# Patient Record
Sex: Female | Born: 1990 | Race: Black or African American | Hispanic: No | Marital: Single | Smoking: Former smoker
Health system: Southern US, Community
[De-identification: ages and names within clinical notes are randomized; demographics above are authoritative.]

## PROBLEM LIST (undated history)

## (undated) ENCOUNTER — Inpatient Hospital Stay (HOSPITAL_COMMUNITY): Payer: Self-pay

## (undated) DIAGNOSIS — L309 Dermatitis, unspecified: Secondary | ICD-10-CM

## (undated) DIAGNOSIS — J45909 Unspecified asthma, uncomplicated: Secondary | ICD-10-CM

## (undated) HISTORY — PX: NO PAST SURGERIES: SHX2092

---

## 2014-03-03 ENCOUNTER — Emergency Department (HOSPITAL_COMMUNITY)
Admission: EM | Admit: 2014-03-03 | Discharge: 2014-03-03 | Disposition: A | Discharge status: Home / Self Care | Payer: Medicaid Other | Source: Home / Self Care | Attending: Family Medicine | Admitting: Family Medicine

## 2014-03-03 ENCOUNTER — Encounter (HOSPITAL_COMMUNITY): Payer: Self-pay | Admitting: Emergency Medicine

## 2014-03-03 DIAGNOSIS — J111 Influenza due to unidentified influenza virus with other respiratory manifestations: Secondary | ICD-10-CM

## 2014-03-03 DIAGNOSIS — R69 Illness, unspecified: Principal | ICD-10-CM

## 2014-03-03 MED ORDER — TRAMADOL HCL 50 MG PO TABS: 50.0000 mg | ORAL_TABLET | Freq: Every evening | ORAL | Status: DC | PRN

## 2014-03-03 MED ORDER — IPRATROPIUM BROMIDE 0.06 % NA SOLN: 2.0000 | Freq: Four times a day (QID) | NASAL | Status: DC

## 2014-03-03 MED ORDER — OSELTAMIVIR PHOSPHATE 75 MG PO CAPS: 75.0000 mg | ORAL_CAPSULE | Freq: Two times a day (BID) | ORAL | Status: DC

## 2014-03-03 NOTE — Discharge Instructions (Signed)
Thank you for coming in today. Take medication as directed. Take Tylenol as well. Call or go to the emergency room if you get worse, have trouble breathing, have chest pains, or palpitations.   Influenza, Adult Influenza ("the flu") is a viral infection of the respiratory tract. It occurs more often in winter months because people spend more time in close contact with one another. Influenza can make you feel very sick. Influenza easily spreads from person to person (contagious). CAUSES  Influenza is caused by a virus that infects the respiratory tract. You can catch the virus by breathing in droplets from an infected person's cough or sneeze. You can also catch the virus by touching something that was recently contaminated with the virus and then touching your mouth, nose, or eyes. SYMPTOMS  Symptoms typically last 4 to 10 days and may include:  Fever.  Chills.  Headache, body aches, and muscle aches.  Sore throat.  Chest discomfort and cough.  Poor appetite.  Weakness or feeling tired.  Dizziness.  Nausea or vomiting. DIAGNOSIS  Diagnosis of influenza is often made based on your history and a physical exam. A nose or throat swab test can be done to confirm the diagnosis. RISKS AND COMPLICATIONS You may be at risk for a more severe case of influenza if you smoke cigarettes, have diabetes, have chronic heart disease (such as heart failure) or lung disease (such as asthma), or if you have a weakened immune system. Elderly people and pregnant women are also at risk for more serious infections. The most common complication of influenza is a lung infection (pneumonia). Sometimes, this complication can require emergency medical care and may be life-threatening. PREVENTION  An annual influenza vaccination (flu shot) is the best way to avoid getting influenza. An annual flu shot is now routinely recommended for all adults in the [place] TREATMENT  In mild cases, influenza goes away on its  own. Treatment is directed at relieving symptoms. For more severe cases, your caregiver may prescribe antiviral medicines to shorten the sickness. Antibiotic medicines are not effective, because the infection is caused by a virus, not by bacteria. HOME CARE INSTRUCTIONS  Only take over-the-counter or prescription medicines for pain, discomfort, or fever as directed by your caregiver.  Use a cool mist humidifier to make breathing easier.  Get plenty of rest until your temperature returns to normal. This usually takes 3 to 4 days.  Drink enough fluids to keep your urine clear or pale yellow.  Cover your mouth and nose when coughing or sneezing, and wash your hands well to avoid spreading the virus.  Stay home from work or school until your fever has been gone for at least 1 full day. SEEK MEDICAL CARE IF:   You have chest pain or a deep cough that worsens or produces more mucus.  You have nausea, vomiting, or diarrhea. SEEK IMMEDIATE MEDICAL CARE IF:   You have difficulty breathing, shortness of breath, or your skin or nails turn bluish.  You have severe neck pain or stiffness.  You have a severe headache, facial pain, or earache.  You have a worsening or recurring fever.  You have nausea or vomiting that cannot be controlled. MAKE SURE YOU:  Understand these instructions.  Will watch your condition.  Will get help right away if you are not doing well or get worse. Document Released: 12/07/2000 Document Revised: 06/10/2012 Document Reviewed: 03/10/2012 [facility] Patient Information 2014 [place]ExitCare, [place].

## 2014-03-03 NOTE — ED Provider Notes (Signed)
Robin Bush is a 23 y.o. female who presents to Urgent Care today for body aches is congestion headache and sore throat. This is also sister with fever and has been present for 2 days. Patient has tried Tylenol and Robitussin which has been minimally effective. She did not receive influenza vaccine this year. She feels well otherwise. No nausea vomiting diarrhea chest pain or trouble breathing.   History reviewed. No pertinent past medical history. History  Substance Use Topics  . Smoking status: Never Smoker   . Smokeless tobacco: Not on file  . Alcohol Use: Yes   ROS as above Medications: No current facility-administered medications for this encounter.   Current Outpatient Prescriptions  Medication Sig Dispense Refill  . ipratropium (ATROVENT) 0.06 % nasal spray Place 2 sprays into both nostrils 4 (four) times daily.  15 mL  1  . oseltamivir (TAMIFLU) 75 MG capsule Take 1 capsule (75 mg total) by mouth every 12 (twelve) hours.  10 capsule  0  . traMADol (ULTRAM) 50 MG tablet Take 1 tablet (50 mg total) by mouth at bedtime as needed (cough).  10 tablet  0    Exam:  BP 127/76  Pulse 85  Temp(Src) 98.5 F (36.9 C) (Oral)  Resp 18  SpO2 98% Gen: Well NAD HEENT: EOMI,  MMM posterior pharynx cobblestoning. Tympanic membranes are normal appearing bilaterally. Lungs: Normal work of breathing. CTABL Heart: RRR no MRG Abd: NABS, Soft. NT, ND Exts: Brisk capillary refill, warm and well perfused.    Assessment and Plan: 23 y.o. female with influenza-like illness. Plan to treat with Tamiflu, Atrovent nasal spray and tramadol for cough suppression. Recommend continuing Tylenol.  Discussed warning signs or symptoms. Please see discharge instructions. Patient expresses understanding.    Rodolph BongEvan S Sherion Dooly, MD 03/03/14 2038

## 2014-03-03 NOTE — ED Notes (Signed)
MD eval only  

## 2014-04-17 ENCOUNTER — Encounter (HOSPITAL_COMMUNITY): Payer: Self-pay | Admitting: Emergency Medicine

## 2014-04-17 ENCOUNTER — Emergency Department (HOSPITAL_COMMUNITY)
Admission: EM | Admit: 2014-04-17 | Discharge: 2014-04-17 | Disposition: A | Discharge status: Home / Self Care | Payer: Medicaid Other | Attending: Emergency Medicine | Admitting: Emergency Medicine

## 2014-04-17 DIAGNOSIS — Z3202 Encounter for pregnancy test, result negative: Secondary | ICD-10-CM | POA: Insufficient documentation

## 2014-04-17 DIAGNOSIS — IMO0002 Reserved for concepts with insufficient information to code with codable children: Secondary | ICD-10-CM | POA: Insufficient documentation

## 2014-04-17 DIAGNOSIS — Z113 Encounter for screening for infections with a predominantly sexual mode of transmission: Secondary | ICD-10-CM | POA: Insufficient documentation

## 2014-04-17 LAB — WET PREP, GENITAL
Trich, Wet Prep: NONE SEEN
Yeast Wet Prep HPF POC: NONE SEEN

## 2014-04-17 LAB — URINALYSIS, ROUTINE W REFLEX MICROSCOPIC
BILIRUBIN URINE: NEGATIVE
GLUCOSE, UA: NEGATIVE mg/dL
Hgb urine dipstick: NEGATIVE
KETONES UR: NEGATIVE mg/dL
LEUKOCYTES UA: NEGATIVE
Nitrite: NEGATIVE
Protein, ur: NEGATIVE mg/dL
SPECIFIC GRAVITY, URINE: 1.016 (ref 1.005–1.030)
Urobilinogen, UA: 0.2 mg/dL (ref 0.0–1.0)
pH: 8 (ref 5.0–8.0)

## 2014-04-17 LAB — PREGNANCY, URINE: Preg Test, Ur: NEGATIVE

## 2014-04-17 MED ORDER — LIDOCAINE HCL (PF) 1 % IJ SOLN
INTRAMUSCULAR | Status: AC
  Administered 2014-04-17: 1 mL
  Filled 2014-04-17: qty 5

## 2014-04-17 MED ORDER — AZITHROMYCIN 250 MG PO TABS
1000.0000 mg | ORAL_TABLET | Freq: Once | ORAL | Status: AC
  Administered 2014-04-17: 1000 mg via ORAL
  Filled 2014-04-17: qty 4

## 2014-04-17 MED ORDER — CEFTRIAXONE SODIUM 250 MG IJ SOLR
250.0000 mg | Freq: Once | INTRAMUSCULAR | Status: AC
  Administered 2014-04-17: 250 mg via INTRAMUSCULAR
  Filled 2014-04-17: qty 250

## 2014-04-17 NOTE — ED Provider Notes (Signed)
CSN: [id number]     Arrival date & time 04/17/14  2047 History   First MD Initiated Contact with Patient 04/17/14 2121     Chief Complaint  Patient presents with  . Vaginal Discharge     (Consider location/radiation/quality/duration/timing/severity/associated sxs/prior Treatment) Patient is a 23 y.o. female presenting with vaginal discharge.  Vaginal Discharge  Pt with no PMH reports she noticed a strange vaginal odor after finishing her menses last week. She douched twice with no improvement. Has not had any significant discharge. No bleeding, no pain. She is concerned about STD. Has been sexually active with a single partner, does not use barrier contraception.   History reviewed. No pertinent past medical history. History reviewed. No pertinent past surgical history. No family history on file. History  Substance Use Topics  . Smoking status: Never Smoker   . Smokeless tobacco: Not on file  . Alcohol Use: Yes   OB History   Grav Para Term Preterm Abortions TAB SAB Ect Mult Living                 Review of Systems  Genitourinary: Positive for vaginal discharge.    All other systems reviewed and are negative except as noted in HPI.    Allergies  Review of patient's allergies indicates no known allergies.  Home Medications   Prior to Admission medications   Medication Sig Start Date End Date Taking? Authorizing Provider  Fluocinolone Acetonide (DERMA-SMOOTHE/FS BODY) 0.01 % OIL Apply 1 application topically daily.   Yes Historical Provider, MD  Multiple Vitamins-Minerals (ADULT ONE DAILY GUMMIES) CHEW Chew 1 tablet by mouth at bedtime.   Yes Historical Provider, MD  Tetrahydrozoline HCl (VISINE OP) Place 1 drop into both eyes daily as needed (red eyes).   Yes Historical Provider, MD   BP 114/70  Pulse 68  Temp(Src) 98 F (36.7 C) (Oral)  Resp 20  Wt 169 lb 3 oz (76.743 kg)  SpO2 100%  LMP 04/02/2014 Physical Exam  Nursing note and vitals  reviewed. Constitutional: She is oriented to person, place, and time. She appears well-developed and well-nourished.  HENT:  Head: Normocephalic and atraumatic.  Eyes: EOM are normal. Pupils are equal, round, and reactive to light.  Neck: Normal range of motion. Neck supple.  Cardiovascular: Normal rate, normal heart sounds and intact distal pulses.   Pulmonary/Chest: Effort normal and breath sounds normal.  Abdominal: Bowel sounds are normal. She exhibits no distension. There is no tenderness.  Genitourinary:  No discharge, no bleeding, no CMT, no adnexal mass or tenderness  Musculoskeletal: Normal range of motion. She exhibits no edema and no tenderness.  Neurological: She is alert and oriented to person, place, and time. She has normal strength. No cranial nerve deficit or sensory deficit.  Skin: Skin is warm and dry. No rash noted.  Psychiatric: She has a normal mood and affect.    ED Course  Procedures (including critical care time) Labs Review Labs Reviewed  WET PREP, GENITAL - Abnormal; Notable for the following:    Clue Cells Wet Prep HPF POC FEW (*)    WBC, Wet Prep HPF POC FEW (*)    All other components within normal limits  GC/CHLAMYDIA PROBE AMP  URINALYSIS, ROUTINE W REFLEX MICROSCOPIC  PREGNANCY, URINE  HIV ANTIBODY (ROUTINE TESTING)    Imaging Review No results found.   EKG Interpretation None      MDM   Final diagnoses:  Screen for STD (sexually transmitted disease)   Wet prep unremarkable.  Pt concerned about STD will give Rocephin/Zithro pending GC/C swab.     Debra Colon B. Bernette MayersSheldon, MD 04/17/14 2312

## 2014-04-17 NOTE — ED Notes (Signed)
Pt states understanding of discharge instructions 

## 2014-04-17 NOTE — ED Notes (Signed)
Pt. requesting STD check reports malodorous vaginal discharge onset this week , deniess dysuria or fever .

## 2014-04-17 NOTE — Discharge Instructions (Signed)
Sexually Transmitted Disease A sexually transmitted disease (STD) is a disease or infection that may be passed (transmitted) from person to person, usually during sexual activity. This may happen by way of saliva, semen, blood, vaginal mucus, or urine. Common STDs include:   Gonorrhea.   Chlamydia.   Syphilis.   HIV and AIDS.   Genital herpes.   Hepatitis B and C.   Trichomonas.   Human papillomavirus (HPV).   Pubic lice.   Scabies.  Mites.  Bacterial vaginosis. WHAT ARE CAUSES OF STDs? An STD may be caused by bacteria, a virus, or parasites. STDs are often transmitted during sexual activity if one person is infected. However, they may also be transmitted through nonsexual means. STDs may be transmitted after:   Sexual intercourse with an infected person.   Sharing sex toys with an infected person.   Sharing needles with an infected person or using unclean piercing or tattoo needles.  Having intimate contact with the genitals, mouth, or rectal areas of an infected person.   Exposure to infected fluids during birth. WHAT ARE THE SIGNS AND SYMPTOMS OF STDs? Different STDs have different symptoms. Some people may not have any symptoms. If symptoms are present, they may include:   Painful or bloody urination.   Pain in the pelvis, abdomen, vagina, anus, throat, or eyes.   Skin rash, itching, irritation, growths, sores (lesions), ulcerations, or warts in the genital or anal area.  Abnormal vaginal discharge with or without bad odor.   Penile discharge in men.   Fever.   Pain or bleeding during sexual intercourse.   Swollen glands in the groin area.   Yellow skin and eyes (jaundice). This is seen with hepatitis.   Swollen testicles.  Infertility.  Sores and blisters in the mouth. HOW ARE STDs DIAGNOSED? To make a diagnosis, your health care provider may:   Take a medical history.   Perform a physical exam.   Take a sample of any  discharge for examination.  Swab the throat, cervix, opening to the penis, rectum, or vagina for testing.  Test a sample of your first morning urine.   Perform blood tests.   Perform a Pap smear, if this applies.   Perform a colposcopy.   Perform a laparoscopy.  HOW ARE STDs TREATED? Treatment depends on the STD. Some STDs may be treated but not cured.   Chlamydia, gonorrhea, trichomonas, and syphilis can be cured with antibiotics.   Genital herpes, hepatitis, and HIV can be treated, but not cured, with prescribed medicines. The medicines lessen symptoms.   Genital warts from HPV can be treated with medicine or by freezing, burning (electrocautery), or surgery. Warts may come back.   HPV cannot be cured with medicine or surgery. However, abnormal areas may be removed from the cervix, vagina, or vulva.   If your diagnosis is confirmed, your recent sexual partners need treatment. This is true even if they are symptom-free or have a negative culture or evaluation. They should not have sex until their health care providers say it is OK. HOW CAN I REDUCE MY RISK OF GETTING AN STD?  Use latex condoms, dental dams, and water-soluble lubricants during sexual activity. Do not use petroleum jelly or oils.  Get vaccinated for HPV and hepatitis. If you have not received these vaccines in the past, talk to your health care provider about whether one or both might be right for you.   Avoid risky sex practices that can break the skin.  WHAT SHOULD   I DO IF I THINK I HAVE AN STD?  See your health care provider.   Inform all sexual partners. They should be tested and treated for any STDs.  Do not have sex until your health care provider says it is OK. WHEN SHOULD I GET HELP? Seek immediate medical care if:  You develop severe abdominal pain.  You are a man and notice swelling or pain in the testicles.  You are a woman and notice swelling or pain in your vagina. Document  Released: 03/02/2003 Document Revised: 09/30/2013 Document Reviewed: 06/30/2013 [employer] Patient Information 2014 [employer].  

## 2014-04-18 LAB — HIV ANTIBODY (ROUTINE TESTING W REFLEX): HIV 1&2 Ab, 4th Generation: NONREACTIVE

## 2014-04-20 LAB — GC/CHLAMYDIA PROBE AMP
CT PROBE, AMP APTIMA: NEGATIVE
GC PROBE AMP APTIMA: NEGATIVE

## 2015-02-17 ENCOUNTER — Emergency Department (HOSPITAL_COMMUNITY): Payer: No Typology Code available for payment source

## 2015-02-17 ENCOUNTER — Encounter (HOSPITAL_COMMUNITY): Payer: Self-pay

## 2015-02-17 ENCOUNTER — Emergency Department (HOSPITAL_COMMUNITY)
Admission: EM | Admit: 2015-02-17 | Discharge: 2015-02-17 | Disposition: A | Discharge status: Home / Self Care | Payer: No Typology Code available for payment source | Attending: Emergency Medicine | Admitting: Emergency Medicine

## 2015-02-17 DIAGNOSIS — S4991XA Unspecified injury of right shoulder and upper arm, initial encounter: Secondary | ICD-10-CM | POA: Diagnosis not present

## 2015-02-17 DIAGNOSIS — S161XXA Strain of muscle, fascia and tendon at neck level, initial encounter: Secondary | ICD-10-CM | POA: Diagnosis not present

## 2015-02-17 DIAGNOSIS — S199XXA Unspecified injury of neck, initial encounter: Secondary | ICD-10-CM | POA: Diagnosis present

## 2015-02-17 DIAGNOSIS — Z91040 Latex allergy status: Secondary | ICD-10-CM | POA: Diagnosis not present

## 2015-02-17 DIAGNOSIS — S79911A Unspecified injury of right hip, initial encounter: Secondary | ICD-10-CM | POA: Insufficient documentation

## 2015-02-17 DIAGNOSIS — S3992XA Unspecified injury of lower back, initial encounter: Secondary | ICD-10-CM | POA: Insufficient documentation

## 2015-02-17 DIAGNOSIS — Y998 Other external cause status: Secondary | ICD-10-CM | POA: Diagnosis not present

## 2015-02-17 DIAGNOSIS — Y9389 Activity, other specified: Secondary | ICD-10-CM | POA: Diagnosis not present

## 2015-02-17 DIAGNOSIS — Z3202 Encounter for pregnancy test, result negative: Secondary | ICD-10-CM | POA: Insufficient documentation

## 2015-02-17 DIAGNOSIS — Y92410 Unspecified street and highway as the place of occurrence of the external cause: Secondary | ICD-10-CM | POA: Insufficient documentation

## 2015-02-17 DIAGNOSIS — S1093XA Contusion of unspecified part of neck, initial encounter: Secondary | ICD-10-CM | POA: Insufficient documentation

## 2015-02-17 DIAGNOSIS — T148XXA Other injury of unspecified body region, initial encounter: Secondary | ICD-10-CM

## 2015-02-17 HISTORY — DX: Unspecified asthma, uncomplicated: J45.909

## 2015-02-17 LAB — POC URINE PREG, ED: PREG TEST UR: NEGATIVE

## 2015-02-17 MED ORDER — OXYCODONE-ACETAMINOPHEN 5-325 MG PO TABS
1.0000 | ORAL_TABLET | Freq: Once | ORAL | Status: AC
  Administered 2015-02-17: 1 via ORAL
  Filled 2015-02-17: qty 1

## 2015-02-17 MED ORDER — IBUPROFEN 200 MG PO TABS
600.0000 mg | ORAL_TABLET | Freq: Once | ORAL | Status: AC
  Administered 2015-02-17: 600 mg via ORAL
  Filled 2015-02-17: qty 3

## 2015-02-17 MED ORDER — CYCLOBENZAPRINE HCL 10 MG PO TABS: 10.0000 mg | ORAL_TABLET | Freq: Two times a day (BID) | ORAL | Status: AC | PRN

## 2015-02-17 MED ORDER — NAPROXEN 500 MG PO TABS: 500.0000 mg | ORAL_TABLET | Freq: Two times a day (BID) | ORAL | Status: DC

## 2015-02-17 MED ORDER — HYDROCODONE-ACETAMINOPHEN 5-325 MG PO TABS: 1.0000 | ORAL_TABLET | Freq: Four times a day (QID) | ORAL | Status: DC | PRN
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
## 2015-02-17 NOTE — ED Notes (Signed)
Pt states to no take BP in right arm due to pain as she persist to text with right arm in the air.
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
## 2015-02-17 NOTE — Discharge Instructions (Signed)
Cervical Sprain °A cervical sprain is an injury in the neck in which the strong, fibrous tissues (ligaments) that connect your neck bones stretch or tear. Cervical sprains can range from mild to severe. Severe cervical sprains can cause the neck vertebrae to be unstable. This can lead to damage of the spinal cord and can result in serious nervous system problems. The amount of time it takes for a cervical sprain to get better depends on the cause and extent of the injury. Most cervical sprains heal in 1 to 3 weeks. °CAUSES  °Severe cervical sprains may be caused by:  °· Contact sport injuries (such as from football, rugby, wrestling, hockey, auto racing, gymnastics, diving, martial arts, or boxing).   °· Motor vehicle collisions.   °· Whiplash injuries. This is an injury from a sudden forward and backward whipping movement of the head and neck.  °· Falls.   °Mild cervical sprains may be caused by:  °· Being in an awkward position, such as while cradling a telephone between your ear and shoulder.   °· Sitting in a chair that does not offer proper support.   °· Working at a poorly designed computer station.   °· Looking up or down for long periods of time.   °SYMPTOMS  °· Pain, soreness, stiffness, or a burning sensation in the front, back, or sides of the neck. This discomfort may develop immediately after the injury or slowly, 24 hours or more after the injury.   °· Pain or tenderness directly in the middle of the back of the neck.   °· Shoulder or upper back pain.   °· Limited ability to move the neck.   °· Headache.   °· Dizziness.   °· Weakness, numbness, or tingling in the hands or arms.   °· Muscle spasms.   °· Difficulty swallowing or chewing.   °· Tenderness and swelling of the neck.   °DIAGNOSIS  °Most of the time your health care provider can diagnose a cervical sprain by taking your history and doing a physical exam. Your health care provider will ask about previous neck injuries and any known neck  problems, such as arthritis in the neck. X-rays may be taken to find out if there are any other problems, such as with the bones of the neck. Other tests, such as a CT scan or MRI, may also be needed.  °TREATMENT  °Treatment depends on the severity of the cervical sprain. Mild sprains can be treated with rest, keeping the neck in place (immobilization), and pain medicines. Severe cervical sprains are immediately immobilized. Further treatment is done to help with pain, muscle spasms, and other symptoms and may include: °· Medicines, such as pain relievers, numbing medicines, or muscle relaxants.   °· Physical therapy. This may involve stretching exercises, strengthening exercises, and posture training. Exercises and improved posture can help stabilize the neck, strengthen muscles, and help stop symptoms from returning.   °HOME CARE INSTRUCTIONS  °· Put ice on the injured area.   °¨ Put ice in a plastic bag.   °¨ Place a towel between your skin and the bag.   °¨ Leave the ice on for 15-20 minutes, 3-4 times a day.   °· If your injury was severe, you may have been given a cervical collar to wear. A cervical collar is a two-piece collar designed to keep your neck from moving while it heals. °¨ Do not remove the collar unless instructed by your health care provider. °¨ If you have long hair, keep it outside of the collar. °¨ Ask your health care provider before making any adjustments to your collar. Minor   adjustments may be required over time to improve comfort and reduce pressure on your chin or on the back of your head. °¨ If you are allowed to remove the collar for cleaning or bathing, follow your health care provider's instructions on how to do so safely. °¨ Keep your collar clean by wiping it with mild soap and water and drying it completely. If the collar you have been given includes removable pads, remove them every 1-2 days and hand wash them with soap and water. Allow them to air dry. They should be completely  dry before you wear them in the collar. °¨ If you are allowed to remove the collar for cleaning and bathing, wash and dry the skin of your neck. Check your skin for irritation or sores. If you see any, tell your health care provider. °¨ Do not drive while wearing the collar.   °· Only take over-the-counter or prescription medicines for pain, discomfort, or fever as directed by your health care provider.   °· Keep all follow-up appointments as directed by your health care provider.   °· Keep all physical therapy appointments as directed by your health care provider.   °· Make any needed adjustments to your workstation to promote good posture.   °· Avoid positions and activities that make your symptoms worse.   °· Warm up and stretch before being active to help prevent problems.   °SEEK MEDICAL CARE IF:  °· Your pain is not controlled with medicine.   °· You are unable to decrease your pain medicine over time as planned.   °· Your activity level is not improving as expected.   °SEEK IMMEDIATE MEDICAL CARE IF:  °· You develop any bleeding. °· You develop stomach upset. °· You have signs of an allergic reaction to your medicine.   °· Your symptoms get worse.   °· You develop new, unexplained symptoms.   °· You have numbness, tingling, weakness, or paralysis in any part of your body.   °MAKE SURE YOU:  °· Understand these instructions. °· Will watch your condition. °· Will get help right away if you are not doing well or get worse. °Document Released: 10/07/2007 Document Revised: 12/15/2013 Document Reviewed: 06/17/2013 °Exit[employer]® Patient Information ©2015 [employer]. This information is not intended to replace advice given to you by your health care provider. Make sure you discuss any questions you have with your health care provider. ° °Contusion °A contusion is a deep bruise. Contusions happen when an injury causes bleeding under the skin. Signs of bruising include pain, puffiness (swelling), and discolored skin.  The contusion may turn blue, purple, or yellow. °HOME CARE  °· Put ice on the injured area. °¨ Put ice in a plastic bag. °¨ Place a towel between your skin and the bag. °¨ Leave the ice on for 15-20 minutes, 03-04 times a day. °· Only take medicine as told by your doctor. °· Rest the injured area. °· If possible, raise (elevate) the injured area to lessen puffiness. °GET HELP RIGHT AWAY IF:  °· You have more bruising or puffiness. °· You have pain that is getting worse. °· Your puffiness or pain is not helped by medicine. °MAKE SURE YOU:  °· Understand these instructions. °· Will watch your condition. °· Will get help right away if you are not doing well or get worse. °Document Released: 05/28/2008 Document Revised: 03/03/2012 Document Reviewed: 10/15/2011 °Exit[employer]® Patient Information ©2015 [employer]. This information is not intended to replace advice given to you by your health care provider. Make sure you discuss any questions you have with   your health care provider. ° °
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
## 2015-09-15 ENCOUNTER — Encounter (HOSPITAL_COMMUNITY): Payer: Self-pay | Admitting: *Deleted

## 2015-09-15 ENCOUNTER — Inpatient Hospital Stay (HOSPITAL_COMMUNITY)
Admission: AD | Admit: 2015-09-15 | Discharge: 2015-09-15 | Disposition: A | Discharge status: Home / Self Care | Payer: Medicaid Other | Source: Ambulatory Visit | Attending: Obstetrics & Gynecology | Admitting: Obstetrics & Gynecology

## 2015-09-15 DIAGNOSIS — Z3A10 10 weeks gestation of pregnancy: Secondary | ICD-10-CM | POA: Insufficient documentation

## 2015-09-15 DIAGNOSIS — J069 Acute upper respiratory infection, unspecified: Secondary | ICD-10-CM | POA: Diagnosis not present

## 2015-09-15 DIAGNOSIS — O99511 Diseases of the respiratory system complicating pregnancy, first trimester: Secondary | ICD-10-CM | POA: Insufficient documentation

## 2015-09-15 DIAGNOSIS — R05 Cough: Secondary | ICD-10-CM | POA: Diagnosis present

## 2015-09-15 DIAGNOSIS — J029 Acute pharyngitis, unspecified: Secondary | ICD-10-CM | POA: Insufficient documentation

## 2015-09-15 DIAGNOSIS — Z87891 Personal history of nicotine dependence: Secondary | ICD-10-CM | POA: Diagnosis not present

## 2015-09-15 DIAGNOSIS — R0989 Other specified symptoms and signs involving the circulatory and respiratory systems: Secondary | ICD-10-CM

## 2015-09-15 HISTORY — DX: Dermatitis, unspecified: L30.9

## 2015-09-15 LAB — URINALYSIS, ROUTINE W REFLEX MICROSCOPIC
BILIRUBIN URINE: NEGATIVE
Glucose, UA: NEGATIVE mg/dL
Ketones, ur: 40 mg/dL — AB
Leukocytes, UA: NEGATIVE
Nitrite: NEGATIVE
Protein, ur: NEGATIVE mg/dL
Urobilinogen, UA: 0.2 mg/dL (ref 0.0–1.0)
pH: 6 (ref 5.0–8.0)

## 2015-09-15 LAB — URINE MICROSCOPIC-ADD ON

## 2015-09-15 LAB — RAPID STREP SCREEN (MED CTR MEBANE ONLY): Streptococcus, Group A Screen (Direct): NEGATIVE

## 2015-09-15 LAB — POCT PREGNANCY, URINE: Preg Test, Ur: POSITIVE — AB

## 2015-09-15 NOTE — Discharge Instructions (Signed)
Cool Mist Vaporizers °Vaporizers may help relieve the symptoms of a cough and cold. They add moisture to the air, which helps mucus to become thinner and less sticky. This makes it easier to breathe and cough up secretions. Cool mist vaporizers do not cause serious burns like hot mist vaporizers, which may also be called steamers or humidifiers. Vaporizers have not been proven to help with colds. You should not use a vaporizer if you are allergic to mold. °HOME CARE INSTRUCTIONS °· Follow the package instructions for the vaporizer. °· Do not use anything other than distilled water in the vaporizer. °· Do not run the vaporizer all of the time. This can cause mold or bacteria to grow in the vaporizer. °· Clean the vaporizer after each time it is used. °· Clean and dry the vaporizer well before storing it. °· Stop using the vaporizer if worsening respiratory symptoms develop. °Document Released: 09/06/2004 Document Revised: 12/15/2013 Document Reviewed: 04/29/2013 °[employer]® Patient Information ©2015 [employer]. This information is not intended to replace advice given to you by your health care provider. Make sure you discuss any questions you have with your health care provider. ° °

## 2015-09-15 NOTE — MAU Note (Signed)
Pt stated she had ben having chills and a cough on and off for about 3 days.

## 2015-09-15 NOTE — MAU Note (Signed)
Patient states she was seen at two clinics to verify pregnancy.  Has [place] pics and paperwork with her.  States she is 10weeks.

## 2015-09-15 NOTE — MAU Provider Note (Signed)
History     CSN: [id number]  Arrival date and time: 09/15/15 1191   First Provider Initiated Contact with Patient 09/15/15 1032      Chief Complaint  Patient presents with  . Chills  . Cough   HPI   Robin Bush is a 24 y.o.female presenting G1P0 at [redacted]w[redacted]d to MAU with chills and cough; the symptoms started 3 days ago. She has not taken her temperature at home however here in MAU it read 98.0.    The cough is random and productive; the cough is not keeping her up at night. + stuffy nose, she has not taken anything over the counter for the symptoms.   2 weeks ago she applied for medicaid and is awaiting that in order to start prenatal care.   OB History    Gravida Para Term Preterm AB TAB SAB Ectopic Multiple Living   1               Past Medical History  Diagnosis Date  . Asthma   . Eczema     Past Surgical History  Procedure Laterality Date  . No past surgeries      History reviewed. No pertinent family history.  Social History  Substance Use Topics  . Smoking status: Former Games developer  . Smokeless tobacco: None  . Alcohol Use: No    Allergies:  Allergies  Allergen Reactions  . Latex Itching    Prescriptions prior to admission  Medication Sig Dispense Refill Last Dose  . Prenatal Vit-Fe Fumarate-FA (PRENATAL MULTIVITAMIN) TABS tablet Take 1 tablet by mouth daily at 12 noon.   09/14/2015 at Unknown time  . cyclobenzaprine (FLEXERIL) 10 MG tablet Take 1 tablet (10 mg total) by mouth 2 (two) times daily as needed for muscle spasms. (Patient not taking: Reported on 09/15/2015) 20 tablet 0   . HYDROcodone-acetaminophen (NORCO/VICODIN) 5-325 MG per tablet Take 1-2 tablets by mouth every 6 (six) hours as needed. (Patient not taking: Reported on 09/15/2015) 10 tablet 0   . naproxen (NAPROSYN) 500 MG tablet Take 1 tablet (500 mg total) by mouth 2 (two) times daily. (Patient not taking: Reported on 09/15/2015) 30 tablet 0    Results for orders placed or performed  during the hospital encounter of 09/15/15 (from the past 48 hour(s))  Urinalysis, Routine w reflex microscopic (not at [facility])     Status: Abnormal   Collection Time: 09/15/15 10:05 AM  Result Value Ref Range   Color, Urine YELLOW YELLOW   APPearance CLEAR CLEAR   Specific Gravity, Urine >1.030 (H) 1.005 - 1.030   pH 6.0 5.0 - 8.0   Glucose, UA NEGATIVE NEGATIVE mg/dL   Hgb urine dipstick TRACE (A) NEGATIVE   Bilirubin Urine NEGATIVE NEGATIVE   Ketones, ur 40 (A) NEGATIVE mg/dL   Protein, ur NEGATIVE NEGATIVE mg/dL   Urobilinogen, UA 0.2 0.0 - 1.0 mg/dL   Nitrite NEGATIVE NEGATIVE   Leukocytes, UA NEGATIVE NEGATIVE  Urine microscopic-add on     Status: Abnormal   Collection Time: 09/15/15 10:05 AM  Result Value Ref Range   Squamous Epithelial / LPF FEW (A) RARE   WBC, UA 0-2 <3 WBC/hpf   RBC / HPF 0-2 <3 RBC/hpf   Bacteria, UA MANY (A) RARE   Urine-Other MUCOUS PRESENT   Culture, OB Urine     Status: None (Preliminary result)   Collection Time: 09/15/15 10:05 AM  Result Value Ref Range   Specimen Description OB CLEAN CATCH    Special Requests  NONE    Culture PENDING    Report Status PENDING   Pregnancy, urine POC     Status: Abnormal   Collection Time: 09/15/15 10:14 AM  Result Value Ref Range   Preg Test, Ur POSITIVE (A) NEGATIVE    Comment:        THE SENSITIVITY OF THIS METHODOLOGY IS >24 mIU/mL   Rapid strep screen     Status: None   Collection Time: 09/15/15 10:50 AM  Result Value Ref Range   Streptococcus, Group A Screen (Direct) NEGATIVE NEGATIVE    Comment: (NOTE) A Rapid Antigen test may result negative if the antigen level in the sample is below the detection level of this test. The FDA has not cleared this test as a stand-alone test therefore the rapid antigen negative result has reflexed to a Group A Strep culture. Performed at [facility]     Review of Systems  Constitutional: Positive for chills. Negative for fever.  HENT: Positive for ear  pain and sore throat. Negative for tinnitus.   Gastrointestinal: Positive for nausea and vomiting. Negative for abdominal pain.  Genitourinary:       Denies vaginal bleeding.   Musculoskeletal: Negative for joint pain.   Physical Exam   Blood pressure 101/60, pulse 87, temperature 98.5 F (36.9 C), resp. rate 16, height  (1.702 m), weight 65.409 kg (144 lb 3.2 oz), last menstrual period 07/07/2015, SpO2 100 %.  Physical Exam  Constitutional: She is oriented to person, place, and time. She appears well-developed and well-nourished. No distress.  HENT:  Head: Normocephalic.  Right Ear: Tympanic membrane normal.  Left Ear: Tympanic membrane normal.  Nose: Right sinus exhibits no maxillary sinus tenderness and no frontal sinus tenderness. Left sinus exhibits no maxillary sinus tenderness and no frontal sinus tenderness.  Mouth/Throat: Mucous membranes are dry. No uvula swelling. Posterior oropharyngeal erythema present. No oropharyngeal exudate, posterior oropharyngeal edema or tonsillar abscesses.  Eyes: Pupils are equal, round, and reactive to light.  Neck: Neck supple.  Cardiovascular: Normal rate and normal heart sounds.   Respiratory: Effort normal and breath sounds normal. No respiratory distress. She has no wheezes. She has no rales. She exhibits no tenderness.  GI: Soft. There is no tenderness.  Musculoskeletal: Normal range of motion.  Lymphadenopathy:    She has no cervical adenopathy.  Neurological: She is alert and oriented to person, place, and time.  Skin: Skin is warm. She is not diaphoretic.  Psychiatric: Her behavior is normal.    MAU Course  Procedures  None  MDM Urine culture pending    Assessment and Plan   A:  1. Upper respiratory symptom   2. Sore throat    P:  Discharge home in stable condition Return to MAU as needed, if symptoms worsen List of safe medications given to take in pregnancy.  Strep swab negative.   Duane Lope,  NP 09/15/2015 10:46 AM

## 2015-09-16 LAB — CULTURE, OB URINE

## 2015-09-18 LAB — CULTURE, GROUP A STREP: Strep A Culture: NEGATIVE

## 2016-07-20 ENCOUNTER — Encounter (HOSPITAL_COMMUNITY): Payer: Self-pay

## 2024-10-28 ENCOUNTER — Other Ambulatory Visit: Payer: Self-pay
